# Patient Record
Sex: Female | Born: 1955 | Race: White | Hispanic: No | Marital: Married | Smoking: Former smoker
Health system: Southern US, Community
[De-identification: ages and names within clinical notes are randomized; demographics above are authoritative.]

## PROBLEM LIST (undated history)

## (undated) DIAGNOSIS — E785 Hyperlipidemia, unspecified: Secondary | ICD-10-CM

## (undated) DIAGNOSIS — Z973 Presence of spectacles and contact lenses: Secondary | ICD-10-CM

## (undated) DIAGNOSIS — F419 Anxiety disorder, unspecified: Secondary | ICD-10-CM

## (undated) DIAGNOSIS — M1990 Unspecified osteoarthritis, unspecified site: Secondary | ICD-10-CM

## (undated) HISTORY — PX: COLONOSCOPY: SHX174

## (undated) HISTORY — PX: WRIST FRACTURE SURGERY: SHX121

## (undated) HISTORY — PX: FRACTURE SURGERY: SHX138

---

## 1998-03-02 HISTORY — PX: SHOULDER ARTHROSCOPY: SHX128

## 2008-10-19 ENCOUNTER — Ambulatory Visit: Payer: Self-pay | Admitting: Family Medicine

## 2008-11-02 ENCOUNTER — Ambulatory Visit: Payer: Self-pay | Admitting: Family Medicine

## 2009-02-01 ENCOUNTER — Ambulatory Visit: Payer: Self-pay | Admitting: Unknown Physician Specialty

## 2009-04-26 ENCOUNTER — Ambulatory Visit: Payer: Self-pay | Admitting: Family Medicine

## 2010-08-14 ENCOUNTER — Ambulatory Visit: Payer: Self-pay | Admitting: Family Medicine

## 2011-08-20 ENCOUNTER — Ambulatory Visit: Payer: Self-pay | Admitting: Family Medicine

## 2011-10-22 ENCOUNTER — Ambulatory Visit: Payer: Self-pay | Admitting: Internal Medicine

## 2012-09-06 ENCOUNTER — Encounter (HOSPITAL_BASED_OUTPATIENT_CLINIC_OR_DEPARTMENT_OTHER): Payer: Self-pay | Admitting: *Deleted

## 2012-09-06 NOTE — Progress Notes (Signed)
No labs needed

## 2012-09-07 ENCOUNTER — Ambulatory Visit (HOSPITAL_BASED_OUTPATIENT_CLINIC_OR_DEPARTMENT_OTHER): Payer: 59 | Admitting: *Deleted

## 2012-09-07 ENCOUNTER — Encounter (HOSPITAL_BASED_OUTPATIENT_CLINIC_OR_DEPARTMENT_OTHER): Payer: Self-pay | Admitting: *Deleted

## 2012-09-07 ENCOUNTER — Ambulatory Visit (HOSPITAL_BASED_OUTPATIENT_CLINIC_OR_DEPARTMENT_OTHER)
Admission: RE | Admit: 2012-09-07 | Discharge: 2012-09-07 | Disposition: A | Discharge status: Home / Self Care | Payer: 59 | Source: Ambulatory Visit | Attending: General Surgery | Admitting: General Surgery

## 2012-09-07 ENCOUNTER — Encounter (HOSPITAL_BASED_OUTPATIENT_CLINIC_OR_DEPARTMENT_OTHER)
Admission: RE | Disposition: A | Discharge status: Home / Self Care | Payer: Self-pay | Source: Ambulatory Visit | Attending: General Surgery

## 2012-09-07 DIAGNOSIS — S52609A Unspecified fracture of lower end of unspecified ulna, initial encounter for closed fracture: Secondary | ICD-10-CM | POA: Insufficient documentation

## 2012-09-07 DIAGNOSIS — S52509A Unspecified fracture of the lower end of unspecified radius, initial encounter for closed fracture: Secondary | ICD-10-CM | POA: Insufficient documentation

## 2012-09-07 DIAGNOSIS — M129 Arthropathy, unspecified: Secondary | ICD-10-CM | POA: Insufficient documentation

## 2012-09-07 DIAGNOSIS — X58XXXA Exposure to other specified factors, initial encounter: Secondary | ICD-10-CM | POA: Insufficient documentation

## 2012-09-07 HISTORY — PX: ORIF RADIAL FRACTURE: SHX5113

## 2012-09-07 HISTORY — DX: Unspecified osteoarthritis, unspecified site: M19.90

## 2012-09-07 HISTORY — DX: Presence of spectacles and contact lenses: Z97.3

## 2012-09-07 HISTORY — DX: Hyperlipidemia, unspecified: E78.5

## 2012-09-07 SURGERY — OPEN REDUCTION INTERNAL FIXATION (ORIF) RADIAL FRACTURE
Anesthesia: General | Site: Wrist | Laterality: Right | Wound class: Clean

## 2012-09-07 MED ORDER — LIDOCAINE HCL (CARDIAC) 20 MG/ML IV SOLN
INTRAVENOUS | Status: DC | PRN
  Administered 2012-09-07: 100 mg via INTRAVENOUS

## 2012-09-07 MED ORDER — LACTATED RINGERS IV SOLN
INTRAVENOUS | Status: DC
  Administered 2012-09-07 (×2): via INTRAVENOUS

## 2012-09-07 MED ORDER — ROPIVACAINE HCL 5 MG/ML IJ SOLN
INTRAMUSCULAR | Status: DC | PRN
  Administered 2012-09-07: 30 mL via EPIDURAL

## 2012-09-07 MED ORDER — MIDAZOLAM HCL 2 MG/2ML IJ SOLN
1.0000 mg | INTRAMUSCULAR | Status: DC | PRN
  Administered 2012-09-07: 2 mg via INTRAVENOUS

## 2012-09-07 MED ORDER — PROPOFOL 10 MG/ML IV BOLUS
INTRAVENOUS | Status: DC | PRN
  Administered 2012-09-07: 150 mg via INTRAVENOUS

## 2012-09-07 MED ORDER — DEXAMETHASONE SODIUM PHOSPHATE 10 MG/ML IJ SOLN
INTRAMUSCULAR | Status: DC | PRN
  Administered 2012-09-07: 4 mg via INTRAVENOUS
  Administered 2012-09-07: 8 mg via INTRAVENOUS

## 2012-09-07 MED ORDER — 0.9 % SODIUM CHLORIDE (POUR BTL) OPTIME
TOPICAL | Status: DC | PRN
  Administered 2012-09-07: 250 mL

## 2012-09-07 MED ORDER — FENTANYL CITRATE 0.05 MG/ML IJ SOLN
50.0000 ug | INTRAMUSCULAR | Status: DC | PRN
  Administered 2012-09-07: 100 ug via INTRAVENOUS

## 2012-09-07 MED ORDER — CEFAZOLIN SODIUM-DEXTROSE 2-3 GM-% IV SOLR
INTRAVENOUS | Status: DC | PRN
  Administered 2012-09-07: 2 g via INTRAVENOUS

## 2012-09-07 SURGICAL SUPPLY — 63 items
BANDAGE ELASTIC 3 VELCRO ST LF (GAUZE/BANDAGES/DRESSINGS) ×2 IMPLANT
BLADE MINI RND TIP GREEN BEAV (BLADE) IMPLANT
BLADE SURG 15 STRL LF DISP TIS (BLADE) ×1 IMPLANT
BLADE SURG 15 STRL SS (BLADE) ×1
BNDG ELASTIC 2 VLCR STRL LF (GAUZE/BANDAGES/DRESSINGS) IMPLANT
BNDG ESMARK 4X9 LF (GAUZE/BANDAGES/DRESSINGS) ×2 IMPLANT
BNDG PLASTER X FAST 3X3 WHT LF (CAST SUPPLIES) IMPLANT
CHLORAPREP W/TINT 26ML (MISCELLANEOUS) ×2 IMPLANT
CLOTH BEACON ORANGE TIMEOUT ST (SAFETY) ×2 IMPLANT
CORDS BIPOLAR (ELECTRODE) ×2 IMPLANT
COVER MAYO STAND STRL (DRAPES) ×2 IMPLANT
COVER TABLE BACK 60X90 (DRAPES) ×2 IMPLANT
CUFF TOURNIQUET SINGLE 18IN (TOURNIQUET CUFF) ×2 IMPLANT
DRAIN TLS ROUND 10FR (DRAIN) IMPLANT
DRAPE EXTREMITY T 121X128X90 (DRAPE) ×2 IMPLANT
DRAPE OEC MINIVIEW 54X84 (DRAPES) ×2 IMPLANT
DRAPE SURG 17X23 STRL (DRAPES) ×2 IMPLANT
GAUZE XEROFORM 1X8 LF (GAUZE/BANDAGES/DRESSINGS) ×2 IMPLANT
GLOVE BIO SURGEON STRL SZ7 (GLOVE) ×2 IMPLANT
GLOVE BIOGEL M STRL SZ7.5 (GLOVE) ×4 IMPLANT
GLOVE BIOGEL PI IND STRL 7.0 (GLOVE) ×1 IMPLANT
GLOVE BIOGEL PI INDICATOR 7.0 (GLOVE) ×1
GLOVE EXAM NITRILE MD LF STRL (GLOVE) ×2 IMPLANT
GOWN BRE IMP PREV XXLGXLNG (GOWN DISPOSABLE) ×2 IMPLANT
GOWN PREVENTION PLUS XLARGE (GOWN DISPOSABLE) ×2 IMPLANT
K-WIRE .045X4 (WIRE) ×2 IMPLANT
NEEDLE HYPO 22GX1.5 SAFETY (NEEDLE) IMPLANT
NS IRRIG 1000ML POUR BTL (IV SOLUTION) ×2 IMPLANT
PACK BASIN DAY SURGERY FS (CUSTOM PROCEDURE TRAY) ×2 IMPLANT
PAD CAST 3X4 CTTN HI CHSV (CAST SUPPLIES) ×1 IMPLANT
PAD CAST 4YDX4 CTTN HI CHSV (CAST SUPPLIES) IMPLANT
PADDING CAST ABS 4INX4YD NS (CAST SUPPLIES) ×1
PADDING CAST ABS COTTON 4X4 ST (CAST SUPPLIES) ×1 IMPLANT
PADDING CAST COTTON 3X4 STRL (CAST SUPPLIES) ×1
PADDING CAST COTTON 4X4 STRL (CAST SUPPLIES)
PADDING CAST SYNTHETIC 3 NS LF (CAST SUPPLIES)
PADDING CAST SYNTHETIC 3X4 NS (CAST SUPPLIES) IMPLANT
PLATE ANATOMIC NARROW RIGHT (Plate) ×2 IMPLANT
SCREW BONE 2.3X12MM (Screw) ×4 IMPLANT
SCREW BONE 2.3X14MM (Screw) ×2 IMPLANT
SCREW BONE 2.7X12 (Screw) ×2 IMPLANT
SCREW LOCK 16X2.3 (Screw) ×2 IMPLANT
SCREW LOCKING 2.7X16 (Screw) ×2 IMPLANT
SCREW LOCKING 2.7X18 (Screw) ×6 IMPLANT
SLEEVE SCD COMPRESS KNEE MED (MISCELLANEOUS) ×2 IMPLANT
SPLINT FIBERGLASS 3X35 (CAST SUPPLIES) ×2 IMPLANT
SPLINT PLASTER CAST XFAST 4X15 (CAST SUPPLIES) IMPLANT
SPLINT PLASTER XTRA FAST SET 4 (CAST SUPPLIES)
SPONGE GAUZE 4X4 12PLY (GAUZE/BANDAGES/DRESSINGS) ×2 IMPLANT
STOCKINETTE 4X48 STRL (DRAPES) ×2 IMPLANT
SUCTION FRAZIER TIP 10 FR DISP (SUCTIONS) IMPLANT
SUT ETHILON 3 0 PS 1 (SUTURE) IMPLANT
SUT ETHILON 4 0 PS 2 18 (SUTURE) IMPLANT
SUT VIC AB 3-0 PS1 18 (SUTURE)
SUT VIC AB 3-0 PS1 18XBRD (SUTURE) IMPLANT
SUT VIC AB 3-0 SH 27 (SUTURE) ×1
SUT VIC AB 3-0 SH 27X BRD (SUTURE) ×1 IMPLANT
SUT VICRYL 4-0 PS2 18IN ABS (SUTURE) ×2 IMPLANT
SYR BULB 3OZ (MISCELLANEOUS) ×2 IMPLANT
SYR CONTROL 10ML LL (SYRINGE) IMPLANT
TOWEL OR 17X24 6PK STRL BLUE (TOWEL DISPOSABLE) ×2 IMPLANT
TUBE CONNECTING 20X1/4 (TUBING) IMPLANT
UNDERPAD 30X30 INCONTINENT (UNDERPADS AND DIAPERS) ×2 IMPLANT

## 2012-09-07 NOTE — Interval H&P Note (Signed)
History and Physical Interval Note:  09/07/2012 11:57 AM  Alexandra Chen  has presented today for surgery, with the diagnosis of Right Distal Radius Fracture  The various methods of treatment have been discussed with the patient and family. After consideration of risks, benefits and other options for treatment, the patient has consented to  Procedure(s): OPEN REDUCTION INTERNAL FIXATION (ORIF) RIGHT DISTAL RADIAL FRACTURE (Right) as a surgical intervention .  The patient's history has been reviewed, patient examined, no change in status, stable for surgery.  I have reviewed the patient's chart and labs.  Questions were answered to the patient's satisfaction.     Elsey Holts CHRISTOPHER

## 2012-09-07 NOTE — Anesthesia Preprocedure Evaluation (Addendum)
Anesthesia Evaluation  Patient identified by MRN, date of birth, ID band Patient awake    Reviewed: Allergy & Precautions, H&P , NPO status , Patient's Chart, lab work & pertinent test results  Airway       Dental   Pulmonary          Cardiovascular     Neuro/Psych    GI/Hepatic   Endo/Other    Renal/GU      Musculoskeletal  (+) Arthritis -,   Abdominal   Peds  Hematology   Anesthesia Other Findings   Reproductive/Obstetrics                           Anesthesia Physical Anesthesia Plan  ASA: II  Anesthesia Plan:    Post-op Pain Management:    Induction:   Airway Management Planned:   Additional Equipment:   Intra-op Plan:   Post-operative Plan:   Informed Consent:   Plan Discussed with:   Anesthesia Plan Comments:         Anesthesia Quick Evaluation

## 2012-09-07 NOTE — H&P (View-Only) (Signed)
S:  Pt seen in preop for pending ORIF of R distal radius, pt's questions answered.  O:Blood pressure 137/72, pulse 57, temperature 98.1 F (36.7 C), temperature source Oral, resp. rate 11, height 5\' 4"  (1.626 m), weight 72.576 kg (160 lb), SpO2 99.00%. No results found for this or any previous visit. Pt's RUE unchanged.  A:R distal radius fracture   P:  Proceed with ORIF R distal radius fx

## 2012-09-07 NOTE — Anesthesia Postprocedure Evaluation (Signed)
Anesthesia Post Note  Patient: Alexandra Chen  Procedure(s) Performed: Procedure(s) (LRB): OPEN REDUCTION INTERNAL FIXATION (ORIF) RIGHT DISTAL RADIAL FRACTURE (Right)  Anesthesia type: general  Patient location: PACU  Post pain: Pain level controlled  Post assessment: Patient's Cardiovascular Status Stable  Last Vitals:  Filed Vitals:   09/07/12 1315  BP: 150/76  Pulse: 63  Temp:   Resp: 9    Post vital signs: Reviewed and stable  Level of consciousness: sedated  Complications: No apparent anesthesia complications

## 2012-09-07 NOTE — Progress Notes (Signed)
S:  Pt seen in preop for pending ORIF of R distal radius, pt's questions answered.  O:Blood pressure 137/72, pulse 57, temperature 98.1 F (36.7 C), temperature source Oral, resp. rate 11, height 5' 4" (1.626 m), weight 72.576 kg (160 lb), SpO2 99.00%. No results found for this or any previous visit. Pt's RUE unchanged.  A:R distal radius fracture   P:  Proceed with ORIF R distal radius fx  

## 2012-09-07 NOTE — Progress Notes (Signed)
Assisted Dr. Ossey with right, ultrasound guided, supraclavicular block. Side rails up, monitors on throughout procedure. See vital signs in flow sheet. Tolerated Procedure well. 

## 2012-09-07 NOTE — Transfer of Care (Signed)
Immediate Anesthesia Transfer of Care Note  Patient: Alexandra Chen  Procedure(s) Performed: Procedure(s): OPEN REDUCTION INTERNAL FIXATION (ORIF) RIGHT DISTAL RADIAL FRACTURE (Right)  Patient Location: PACU  Anesthesia Type:General and Regional  Level of Consciousness: awake, alert  and oriented  Airway & Oxygen Therapy: Patient Spontanous Breathing and Patient connected to face mask oxygen  Post-op Assessment: Report given to PACU RN and Post -op Vital signs reviewed and stable  Post vital signs: Reviewed and stable  Complications: No apparent anesthesia complications

## 2012-09-07 NOTE — Anesthesia Procedure Notes (Addendum)
Anesthesia Regional Block:  Supraclavicular block  Pre-Anesthetic Checklist: ,, timeout performed, Correct Patient, Correct Site, Correct Laterality, Correct Procedure, Correct Position, site marked, Risks and benefits discussed,  Surgical consent,  Pre-op evaluation,  At surgeon's request and post-op pain management  Laterality: Right  Prep: chloraprep       Needles:  Injection technique: Single-shot  Needle Type: Echogenic Stimulator Needle     Needle Length: 5cm 5 cm     Additional Needles:  Procedures: ultrasound guided (picture in chart) and nerve stimulator Supraclavicular block  Nerve Stimulator or Paresthesia:  Response: 0.4 mA,   Additional Responses:   Narrative:  Start time: 09/07/2012 10:56 AM End time: 09/07/2012 11:04 AM Injection made incrementally with aspirations every 5 mL.  Performed by: Personally  Anesthesiologist: Arta Bruce MD  Additional Notes: Monitors applied. Patient sedated. Sterile prep and drape,hand hygiene and sterile gloves were used. Relevant anatomy identified.Needle position confirmed.Local anesthetic injected incrementally after negative aspiration. Local anesthetic spread visualized around nerve(s). Vascular puncture avoided. No complications. Image printed for medical record.The patient tolerated the procedure well.       Supraclavicular block Procedure Name: LMA Insertion Date/Time: 09/07/2012 11:54 AM Performed by: Suann Larry WOLFE Pre-anesthesia Checklist: Patient identified, Emergency Drugs available, Suction available and Patient being monitored Patient Re-evaluated:Patient Re-evaluated prior to inductionOxygen Delivery Method: Circle System Utilized Preoxygenation: Pre-oxygenation with 100% oxygen Intubation Type: IV induction Ventilation: Mask ventilation without difficulty LMA: LMA inserted LMA Size: 4.0 Number of attempts: 1 Airway Equipment and Method: bite block Placement Confirmation: positive ETCO2 and breath  sounds checked- equal and bilateral Tube secured with: Tape Dental Injury: Teeth and Oropharynx as per pre-operative assessment

## 2012-09-08 ENCOUNTER — Encounter (HOSPITAL_BASED_OUTPATIENT_CLINIC_OR_DEPARTMENT_OTHER): Payer: Self-pay | Admitting: General Surgery

## 2012-09-08 NOTE — Op Note (Signed)
NAME:  Alexandra Chen, BLALOCK NO.:  [id number]  MEDICAL RECORD NO.:  [id number]  LOCATION:                               FACILITY:  [facility]  PHYSICIAN:  Johnette Abraham, MD    DATE OF BIRTH:  Mar 25, 1955  DATE OF PROCEDURE:  1955/10/16 DATE OF DISCHARGE:  09/07/2012                              OPERATIVE REPORT   PREOPERATIVE DIAGNOSIS:  Closed fracture to the right distal radius and ulnar styloid.  POSTOPERATIVE DIAGNOSIS:  Closed fracture to the right distal radius and ulnar styloid.  PROCEDURE:  Open reduction and internal fixation of the right distal radius fracture with a Stryker volar plate.  ANESTHESIA:  Supraclavicular block and general anesthesia.  ESTIMATED BLOOD LOSS:  Minimal.  COMPLICATIONS:  No acute complications.  SPECIMENS:  No specimens.  INDICATIONS:  [name] is a pleasant lady who fell off a horse over the weekend sustaining closed fracture to her wrist.  She is seen in my office for definitive care.  Risks, benefits, and alternatives of surgical fixation were thoroughly discussed with her.  She agreed to proceed with this course of action.  Consent was obtained.  PROCEDURE:  The patient was taken to the operating room after Anesthesia provided a supraclavicular block.  She was placed supine on the operating table.  Time-out was performed.  General anesthesia was administered.  The right upper extremity was prepped and draped in normal sterile fashion.  The arm was exsanguinated and tourniquet was inflated to 250 mmHg.  A volar incision was made overlying the FCR tendon.  Dissection was carried down from the FCR tendon to the radial artery and pronator quadratus muscle.  Pronator quadratus muscle was taken down in a hockey-stick type fashion exposing the fracture site. Fracture site was in multiple parts, it was intra-articular.  Fracture site was washed out.  Muscle was removed from the fracture site and was reduced.  Appropriate-sized  Stryker volar plate was chosen.  Temporarily held in place with K-wires.  Fluoroscopy revealed good plate placement. Afterwards the radial shaft screws were each drilled and appropriate size cortical screws were placed.  Again fluoroscopy was used to check the plate placement and screw length, which was good.  Afterwards the remaining distal radius screws were each drilled and appropriate size locking screws were placed.  Fluoroscopy again was used to confirm no screw was in the joint and good screw and plate placement.  Afterwards, the wound was irrigated with irrigation solution.  The pronator quadratus muscle was loosely approximated over the plate.  The deep fascia was closed as well as subcutaneous tissues with 3-0 Vicryl and the skin was closed with running 4-0 Vicryl.  Sterile dressing and splint were applied.  The patient tolerated the procedure well.  Fingers were nice and pink at the conclusion of the procedure.     Johnette Abraham, MD     HCC/MEDQ  D:  09/07/2012  T:  09/08/2012  Job:  [id number]

## 2012-09-14 ENCOUNTER — Ambulatory Visit: Payer: Self-pay | Admitting: Internal Medicine

## 2013-09-15 ENCOUNTER — Ambulatory Visit: Payer: Self-pay | Admitting: Internal Medicine

## 2013-11-10 DIAGNOSIS — M810 Age-related osteoporosis without current pathological fracture: Secondary | ICD-10-CM | POA: Insufficient documentation

## 2014-09-28 ENCOUNTER — Other Ambulatory Visit: Payer: Self-pay | Admitting: Internal Medicine

## 2014-09-28 DIAGNOSIS — Z1231 Encounter for screening mammogram for malignant neoplasm of breast: Secondary | ICD-10-CM

## 2014-09-28 DIAGNOSIS — E782 Mixed hyperlipidemia: Secondary | ICD-10-CM | POA: Insufficient documentation

## 2014-09-28 DIAGNOSIS — I10 Essential (primary) hypertension: Secondary | ICD-10-CM | POA: Insufficient documentation

## 2014-10-02 ENCOUNTER — Ambulatory Visit
Admission: RE | Admit: 2014-10-02 | Discharge: 2014-10-02 | Disposition: A | Discharge status: Home / Self Care | Payer: 59 | Source: Ambulatory Visit | Attending: Internal Medicine | Admitting: Internal Medicine

## 2014-10-02 DIAGNOSIS — Z1231 Encounter for screening mammogram for malignant neoplasm of breast: Secondary | ICD-10-CM | POA: Insufficient documentation

## 2014-10-04 ENCOUNTER — Other Ambulatory Visit: Payer: Self-pay | Admitting: Internal Medicine

## 2014-10-04 DIAGNOSIS — N63 Unspecified lump in unspecified breast: Secondary | ICD-10-CM

## 2014-10-04 DIAGNOSIS — R928 Other abnormal and inconclusive findings on diagnostic imaging of breast: Secondary | ICD-10-CM

## 2014-10-12 ENCOUNTER — Ambulatory Visit: Payer: 59

## 2014-10-12 ENCOUNTER — Ambulatory Visit
Admission: RE | Admit: 2014-10-12 | Discharge: 2014-10-12 | Disposition: A | Discharge status: Home / Self Care | Payer: 59 | Source: Ambulatory Visit | Attending: Internal Medicine | Admitting: Internal Medicine

## 2014-10-12 DIAGNOSIS — N63 Unspecified lump in unspecified breast: Secondary | ICD-10-CM

## 2014-10-12 DIAGNOSIS — R928 Other abnormal and inconclusive findings on diagnostic imaging of breast: Secondary | ICD-10-CM

## 2015-09-16 ENCOUNTER — Other Ambulatory Visit: Payer: Self-pay | Admitting: Internal Medicine

## 2015-09-16 DIAGNOSIS — Z1231 Encounter for screening mammogram for malignant neoplasm of breast: Secondary | ICD-10-CM

## 2015-10-04 ENCOUNTER — Other Ambulatory Visit: Payer: Self-pay | Admitting: Internal Medicine

## 2015-10-04 ENCOUNTER — Ambulatory Visit
Admission: RE | Admit: 2015-10-04 | Discharge: 2015-10-04 | Disposition: A | Discharge status: Home / Self Care | Payer: 59 | Source: Ambulatory Visit | Attending: Internal Medicine | Admitting: Internal Medicine

## 2015-10-04 DIAGNOSIS — Z1231 Encounter for screening mammogram for malignant neoplasm of breast: Secondary | ICD-10-CM

## 2015-10-11 ENCOUNTER — Ambulatory Visit: Payer: 59

## 2016-04-03 DIAGNOSIS — R0683 Snoring: Secondary | ICD-10-CM | POA: Insufficient documentation

## 2016-07-31 DIAGNOSIS — G4733 Obstructive sleep apnea (adult) (pediatric): Secondary | ICD-10-CM | POA: Insufficient documentation

## 2016-09-18 ENCOUNTER — Other Ambulatory Visit: Payer: Self-pay | Admitting: Internal Medicine

## 2016-09-18 DIAGNOSIS — Z1231 Encounter for screening mammogram for malignant neoplasm of breast: Secondary | ICD-10-CM

## 2016-10-09 ENCOUNTER — Ambulatory Visit
Admission: RE | Admit: 2016-10-09 | Discharge: 2016-10-09 | Disposition: A | Discharge status: Home / Self Care | Payer: 59 | Source: Ambulatory Visit | Attending: Internal Medicine | Admitting: Internal Medicine

## 2016-10-09 DIAGNOSIS — Z1231 Encounter for screening mammogram for malignant neoplasm of breast: Secondary | ICD-10-CM | POA: Diagnosis present

## 2017-02-12 DIAGNOSIS — M62838 Other muscle spasm: Secondary | ICD-10-CM | POA: Insufficient documentation

## 2017-09-17 ENCOUNTER — Other Ambulatory Visit: Payer: Self-pay | Admitting: Internal Medicine

## 2017-09-17 DIAGNOSIS — Z1231 Encounter for screening mammogram for malignant neoplasm of breast: Secondary | ICD-10-CM

## 2017-10-15 ENCOUNTER — Ambulatory Visit
Admission: RE | Admit: 2017-10-15 | Discharge: 2017-10-15 | Disposition: A | Discharge status: Home / Self Care | Payer: Managed Care, Other (non HMO) | Source: Ambulatory Visit | Attending: Internal Medicine | Admitting: Internal Medicine

## 2017-10-15 DIAGNOSIS — Z1231 Encounter for screening mammogram for malignant neoplasm of breast: Secondary | ICD-10-CM | POA: Diagnosis not present

## 2017-12-24 DIAGNOSIS — F411 Generalized anxiety disorder: Secondary | ICD-10-CM | POA: Insufficient documentation

## 2018-09-16 ENCOUNTER — Other Ambulatory Visit: Payer: Self-pay | Admitting: Physician Assistant

## 2018-09-16 DIAGNOSIS — Z1231 Encounter for screening mammogram for malignant neoplasm of breast: Secondary | ICD-10-CM

## 2018-10-20 ENCOUNTER — Ambulatory Visit
Admission: RE | Admit: 2018-10-20 | Discharge: 2018-10-20 | Disposition: A | Discharge status: Home / Self Care | Payer: Managed Care, Other (non HMO) | Source: Ambulatory Visit | Attending: Physician Assistant | Admitting: Physician Assistant

## 2018-10-20 DIAGNOSIS — Z1231 Encounter for screening mammogram for malignant neoplasm of breast: Secondary | ICD-10-CM | POA: Insufficient documentation

## 2019-06-23 ENCOUNTER — Other Ambulatory Visit: Payer: Self-pay

## 2019-06-23 ENCOUNTER — Ambulatory Visit (INDEPENDENT_AMBULATORY_CARE_PROVIDER_SITE_OTHER): Payer: Self-pay | Admitting: Gastroenterology

## 2019-06-23 DIAGNOSIS — S62109A Fracture of unspecified carpal bone, unspecified wrist, initial encounter for closed fracture: Secondary | ICD-10-CM | POA: Insufficient documentation

## 2019-06-23 DIAGNOSIS — Z1211 Encounter for screening for malignant neoplasm of colon: Secondary | ICD-10-CM

## 2019-06-23 DIAGNOSIS — M1990 Unspecified osteoarthritis, unspecified site: Secondary | ICD-10-CM | POA: Insufficient documentation

## 2019-06-23 NOTE — Progress Notes (Signed)
Gastroenterology Pre-Procedure Review  Request Date: 07/21/19 Requesting Physician: Dr. Tobi Bastos  PATIENT REVIEW QUESTIONS: The patient responded to the following health history questions as indicated:    1. Are you having any GI issues? no 2. Do you have a personal history of Polyps? no 3. Do you have a family history of Colon Cancer or Polyps? no 4. Diabetes Mellitus? no 5. Joint replacements in the past 12 months?no 6. Major health problems in the past 3 months?no 7. Any artificial heart valves, MVP, or defibrillator?no    MEDICATIONS & ALLERGIES:    Patient reports the following regarding taking any anticoagulation/antiplatelet therapy:   Plavix, Coumadin, Eliquis, Xarelto, Lovenox, Pradaxa, Brilinta, or Effient? no Aspirin? no  Patient confirms/reports the following medications:  Current Outpatient Medications  Medication Sig Dispense Refill  . amLODipine (NORVASC) 5 MG tablet Take by mouth.    . diclofenac (VOLTAREN) 75 MG EC tablet TAKE 1 TABLET BY MOUTH  TWICE DAILY WITH MEALS    . escitalopram (LEXAPRO) 20 MG tablet Take by mouth.    . hydrochlorothiazide (HYDRODIURIL) 12.5 MG tablet TAKE 1 TABLET BY MOUTH ONCE DAILY    . ibandronate (BONIVA) 150 MG tablet Take by mouth.    Marland Kitchen tiZANidine (ZANAFLEX) 2 MG tablet Take by mouth.    Marland Kitchen atorvastatin (LIPITOR) 10 MG tablet Take 10 mg by mouth daily.    . B Complex-C (B-COMPLEX WITH VITAMIN C) tablet Take 1 tablet by mouth daily.    . cholecalciferol (VITAMIN D) 1000 UNITS tablet Take 1,000 Units by mouth daily.    Marland Kitchen glucosamine-chondroitin 500-400 MG tablet Take 1 tablet by mouth 3 (three) times daily.    Marland Kitchen HYDROcodone-acetaminophen (LORTAB) 10-500 MG per tablet Take 1 tablet by mouth every 6 (six) hours as needed for pain.    . Multiple Vitamin (MULTI-VITAMIN) tablet Take by mouth.    . Multiple Vitamins-Minerals (MULTIVITAMIN WITH MINERALS) tablet Take 1 tablet by mouth daily.     No current facility-administered medications for  this visit.    Patient confirms/reports the following allergies:  Allergies  Allergen Reactions  . Sulfa Antibiotics Rash and Hives    No orders of the defined types were placed in this encounter.   AUTHORIZATION INFORMATION Primary Insurance: 1D#: Group #:  Secondary Insurance: 1D#: Group #:  SCHEDULE INFORMATION: Date: 07/21/19 Time: Location:[facility]

## 2019-06-23 NOTE — Progress Notes (Signed)
Gastroenterology Pre-Procedure Review  Request Date: 07/21/19 Requesting Physician: Dr. Tobi Bastos  PATIENT REVIEW QUESTIONS: The patient responded to the following health history questions as indicated:    1. Are you having any GI issues? no 2. Do you have a personal history of Polyps? no 3. Do you have a family history of Colon Cancer or Polyps? no 4. Diabetes Mellitus? no 5. Joint replacements in the past 12 months?no 6. Major health problems in the past 3 months?no 7. Any artificial heart valves, MVP, or defibrillator?no    MEDICATIONS & ALLERGIES:    Patient reports the following regarding taking any anticoagulation/antiplatelet therapy:   Plavix, Coumadin, Eliquis, Xarelto, Lovenox, Pradaxa, Brilinta, or Effient? no Aspirin? no  Patient confirms/reports the following medications:  Current Outpatient Medications  Medication Sig Dispense Refill  . amLODipine (NORVASC) 5 MG tablet Take by mouth.    Marland Kitchen atorvastatin (LIPITOR) 10 MG tablet Take 10 mg by mouth daily.    . B Complex-C (B-COMPLEX WITH VITAMIN C) tablet Take 1 tablet by mouth daily.    . cholecalciferol (VITAMIN D) 1000 UNITS tablet Take 1,000 Units by mouth daily.    . diclofenac (VOLTAREN) 75 MG EC tablet TAKE 1 TABLET BY MOUTH  TWICE DAILY WITH MEALS    . escitalopram (LEXAPRO) 20 MG tablet Take by mouth.    Marland Kitchen glucosamine-chondroitin 500-400 MG tablet Take 1 tablet by mouth 3 (three) times daily.    . hydrochlorothiazide (HYDRODIURIL) 12.5 MG tablet TAKE 1 TABLET BY MOUTH ONCE DAILY    . HYDROcodone-acetaminophen (LORTAB) 10-500 MG per tablet Take 1 tablet by mouth every 6 (six) hours as needed for pain.    Marland Kitchen ibandronate (BONIVA) 150 MG tablet Take by mouth.    . Multiple Vitamin (MULTI-VITAMIN) tablet Take by mouth.    . Multiple Vitamins-Minerals (MULTIVITAMIN WITH MINERALS) tablet Take 1 tablet by mouth daily.    Marland Kitchen tiZANidine (ZANAFLEX) 2 MG tablet Take by mouth.     No current facility-administered medications for  this visit.    Patient confirms/reports the following allergies:  Allergies  Allergen Reactions  . Sulfa Antibiotics Rash and Hives    No orders of the defined types were placed in this encounter.   AUTHORIZATION INFORMATION Primary Insurance: 1D#: Group #:  Secondary Insurance: 1D#: Group #:  SCHEDULE INFORMATION: Date: 07/21/19 Time: Location:[facility]

## 2019-07-04 ENCOUNTER — Telehealth: Payer: Self-pay

## 2019-07-04 NOTE — Telephone Encounter (Signed)
LVM asking patient to call me back in regards to her colonoscopy instructions.  They were provided upon her nurse visit with me to schedule, however I will be happy to mail a copy of the instructions.  Thanks,  [place], [place]

## 2019-07-04 NOTE — Telephone Encounter (Signed)
Called patient back.  She has been rescheduled from 06/07 to 08/04/19 with Dr. Tobi Bastos.  Trish in Endo has been made aware of date change.  New instructions will be mailed to patient to reflect this date.  Referral updated.  Thanks [place], [place]

## 2019-08-02 ENCOUNTER — Other Ambulatory Visit: Payer: Self-pay

## 2019-08-02 ENCOUNTER — Other Ambulatory Visit
Admission: RE | Admit: 2019-08-02 | Discharge: 2019-08-02 | Disposition: A | Discharge status: Home / Self Care | Payer: Managed Care, Other (non HMO) | Source: Ambulatory Visit | Attending: Gastroenterology | Admitting: Gastroenterology

## 2019-08-02 DIAGNOSIS — Z20822 Contact with and (suspected) exposure to covid-19: Secondary | ICD-10-CM | POA: Insufficient documentation

## 2019-08-02 DIAGNOSIS — Z01812 Encounter for preprocedural laboratory examination: Secondary | ICD-10-CM | POA: Diagnosis present

## 2019-08-02 LAB — SARS CORONAVIRUS 2 (TAT 6-24 HRS): SARS Coronavirus 2: NEGATIVE

## 2019-08-04 ENCOUNTER — Encounter: Payer: Self-pay | Admitting: Gastroenterology

## 2019-08-04 ENCOUNTER — Ambulatory Visit: Payer: Managed Care, Other (non HMO) | Admitting: Anesthesiology

## 2019-08-04 ENCOUNTER — Ambulatory Visit
Admission: RE | Admit: 2019-08-04 | Discharge: 2019-08-04 | Disposition: A | Discharge status: Home / Self Care | Payer: Managed Care, Other (non HMO) | Attending: Gastroenterology | Admitting: Gastroenterology

## 2019-08-04 ENCOUNTER — Other Ambulatory Visit: Payer: Self-pay

## 2019-08-04 ENCOUNTER — Encounter
Admission: RE | Disposition: A | Discharge status: Home / Self Care | Payer: Self-pay | Source: Home / Self Care | Attending: Gastroenterology

## 2019-08-04 DIAGNOSIS — Z87891 Personal history of nicotine dependence: Secondary | ICD-10-CM | POA: Diagnosis not present

## 2019-08-04 DIAGNOSIS — Z882 Allergy status to sulfonamides status: Secondary | ICD-10-CM | POA: Insufficient documentation

## 2019-08-04 DIAGNOSIS — F419 Anxiety disorder, unspecified: Secondary | ICD-10-CM | POA: Insufficient documentation

## 2019-08-04 DIAGNOSIS — M1990 Unspecified osteoarthritis, unspecified site: Secondary | ICD-10-CM | POA: Diagnosis not present

## 2019-08-04 DIAGNOSIS — K5730 Diverticulosis of large intestine without perforation or abscess without bleeding: Secondary | ICD-10-CM | POA: Diagnosis not present

## 2019-08-04 DIAGNOSIS — E785 Hyperlipidemia, unspecified: Secondary | ICD-10-CM | POA: Diagnosis not present

## 2019-08-04 DIAGNOSIS — Z79899 Other long term (current) drug therapy: Secondary | ICD-10-CM | POA: Diagnosis not present

## 2019-08-04 DIAGNOSIS — Z1211 Encounter for screening for malignant neoplasm of colon: Secondary | ICD-10-CM | POA: Diagnosis not present

## 2019-08-04 DIAGNOSIS — Z791 Long term (current) use of non-steroidal anti-inflammatories (NSAID): Secondary | ICD-10-CM | POA: Insufficient documentation

## 2019-08-04 HISTORY — PX: COLONOSCOPY WITH PROPOFOL: SHX5780

## 2019-08-04 HISTORY — DX: Anxiety disorder, unspecified: F41.9

## 2019-08-04 SURGERY — COLONOSCOPY WITH PROPOFOL
Anesthesia: General

## 2019-08-04 MED ORDER — SODIUM CHLORIDE 0.9 % IV SOLN
INTRAVENOUS | Status: DC
  Administered 2019-08-04: 1000 mL via INTRAVENOUS

## 2019-08-04 MED ORDER — GLYCOPYRROLATE 0.2 MG/ML IJ SOLN
INTRAMUSCULAR | Status: DC | PRN
  Administered 2019-08-04: .2 mg via INTRAVENOUS

## 2019-08-04 MED ORDER — GLYCOPYRROLATE 0.2 MG/ML IJ SOLN
INTRAMUSCULAR | Status: AC
  Filled 2019-08-04: qty 1

## 2019-08-04 MED ORDER — PROPOFOL 500 MG/50ML IV EMUL
INTRAVENOUS | Status: DC | PRN
  Administered 2019-08-04: 150 ug/kg/min via INTRAVENOUS

## 2019-08-04 MED ORDER — LIDOCAINE HCL (CARDIAC) PF 100 MG/5ML IV SOSY
PREFILLED_SYRINGE | INTRAVENOUS | Status: DC | PRN
  Administered 2019-08-04: 80 mg via INTRAVENOUS
  Administered 2019-08-04: 20 mg via INTRAVENOUS

## 2019-08-04 MED ORDER — PROPOFOL 10 MG/ML IV BOLUS
INTRAVENOUS | Status: DC | PRN
  Administered 2019-08-04: 70 mg via INTRAVENOUS

## 2019-08-04 NOTE — Transfer of Care (Signed)
Immediate Anesthesia Transfer of Care Note  Patient: Alexandra Chen  Procedure(s) Performed: COLONOSCOPY WITH PROPOFOL (N/A )  Patient Location: Endoscopy Unit  Anesthesia Type:General  Level of Consciousness: drowsy  Airway & Oxygen Therapy: Patient Spontanous Breathing  Post-op Assessment: Report given to RN and Post -op Vital signs reviewed and stable  Post vital signs: Reviewed and stable  Last Vitals:  Vitals Value Taken Time  BP    Temp    Pulse 66 08/04/19 1032  Resp 11 08/04/19 1032  SpO2 98 % 08/04/19 1032  Vitals shown include unvalidated device data.  Last Pain:  Vitals:   08/04/19 0931  TempSrc: Temporal  PainSc: 0-No pain         Complications: No apparent anesthesia complications

## 2019-08-04 NOTE — Anesthesia Preprocedure Evaluation (Signed)
Anesthesia Evaluation  Patient identified by MRN, date of birth, ID band Patient awake    Reviewed: Allergy & Precautions, H&P , NPO status , Patient's Chart, lab work & pertinent test results  History of Anesthesia Complications Negative for: history of anesthetic complications  Airway Mallampati: III  TM Distance: >3 FB Neck ROM: full    Dental  (+) Chipped   Pulmonary neg shortness of breath, sleep apnea and Continuous Positive Airway Pressure Ventilation , former smoker,    Pulmonary exam normal        Cardiovascular Exercise Tolerance: Good hypertension, (-) angina(-) Past MI and (-) DOE Normal cardiovascular exam     Neuro/Psych PSYCHIATRIC DISORDERS negative neurological ROS     GI/Hepatic negative GI ROS, Neg liver ROS, neg GERD  ,  Endo/Other  negative endocrine ROS  Renal/GU negative Renal ROS  negative genitourinary   Musculoskeletal  (+) Arthritis ,   Abdominal   Peds  Hematology negative hematology ROS (+)   Anesthesia Other Findings Past Medical History: No date: Anxiety No date: Arthritis No date: Hyperlipemia No date: Wears glasses  Past Surgical History: No date: COLONOSCOPY No date: FRACTURE SURGERY 09/07/2012: ORIF RADIAL FRACTURE; Right     Comment:  Procedure: OPEN REDUCTION INTERNAL FIXATION (ORIF) RIGHT              DISTAL RADIAL FRACTURE;  Surgeon: Knute Neu, MD;                Location: [facility];  Service: Plastics;               Laterality: Right; 2000: SHOULDER ARTHROSCOPY     Comment:  right No date: WRIST FRACTURE SURGERY     Comment:  rt set p fx  BMI    Body Mass Index: 30.18 kg/m      Reproductive/Obstetrics negative OB ROS                             Anesthesia Physical Anesthesia Plan  ASA: III  Anesthesia Plan: General   Post-op Pain Management:    Induction: Intravenous  PONV Risk Score and Plan: Propofol  infusion and TIVA  Airway Management Planned: Natural Airway and Nasal Cannula  Additional Equipment:   Intra-op Plan:   Post-operative Plan:   Informed Consent: I have reviewed the patients History and Physical, chart, labs and discussed the procedure including the risks, benefits and alternatives for the proposed anesthesia with the patient or authorized representative who has indicated his/her understanding and acceptance.     Dental Advisory Given  Plan Discussed with: Anesthesiologist, CRNA and Surgeon  Anesthesia Plan Comments: (Patient consented for risks of anesthesia including but not limited to:  - adverse reactions to medications - risk of intubation if required - damage to eyes, teeth, lips or other oral mucosa - nerve damage due to positioning  - sore throat or hoarseness - Damage to heart, brain, nerves, lungs, other parts of body or loss of life  Patient voiced understanding.)        Anesthesia Quick Evaluation

## 2019-08-04 NOTE — H&P (Signed)
Wyline Mood, MD [address], [place], [place], [address] [address], [address], [place], [place], [address] Phone: [phone]  Fax: [phone]  Primary Care Physician:  Patrice Paradise, MD   Pre-Procedure History & Physical: HPI:  Alexandra Chen is a 64 y.o. female is here for an colonoscopy.   Past Medical History:  Diagnosis Date  . Anxiety   . Arthritis   . Hyperlipemia   . Wears glasses     Past Surgical History:  Procedure Laterality Date  . COLONOSCOPY    . FRACTURE SURGERY    . ORIF RADIAL FRACTURE Right 09/07/2012   Procedure: OPEN REDUCTION INTERNAL FIXATION (ORIF) RIGHT DISTAL RADIAL FRACTURE;  Surgeon: Knute Neu, MD;  Location: [facility];  Service: Plastics;  Laterality: Right;  . SHOULDER ARTHROSCOPY  2000   right  . WRIST FRACTURE SURGERY     rt set p fx    Prior to Admission medications   Medication Sig Start Date End Date Taking? Authorizing Provider  amLODipine (NORVASC) 5 MG tablet Take by mouth. 11/28/18  Yes [provider]  atorvastatin (LIPITOR) 10 MG tablet Take 10 mg by mouth daily.   Yes [provider]  B Complex-C (B-COMPLEX WITH VITAMIN C) tablet Take 1 tablet by mouth daily.   Yes [provider]  cholecalciferol (VITAMIN D) 1000 UNITS tablet Take 1,000 Units by mouth daily.   Yes [provider]  diclofenac (VOLTAREN) 75 MG EC tablet TAKE 1 TABLET BY MOUTH  TWICE DAILY WITH MEALS 02/06/19  Yes [provider]  escitalopram (LEXAPRO) 20 MG tablet Take by mouth. 02/09/19  Yes [provider]  glucosamine-chondroitin 500-400 MG tablet Take 1 tablet by mouth 3 (three) times daily.   Yes [provider]  hydrochlorothiazide (HYDRODIURIL) 12.5 MG tablet TAKE 1 TABLET BY MOUTH ONCE DAILY 02/15/19  Yes [provider]  HYDROcodone-acetaminophen (LORTAB) 10-500 MG per tablet Take 1 tablet by mouth every 6 (six) hours as needed for pain.    Yes [provider]  ibandronate (BONIVA) 150 MG tablet Take by mouth. 11/28/18  Yes [provider]  Multiple Vitamin (MULTI-VITAMIN) tablet Take by mouth.   Yes [provider]  Multiple Vitamins-Minerals (MULTIVITAMIN WITH MINERALS) tablet Take 1 tablet by mouth daily.   Yes [provider]  tiZANidine (ZANAFLEX) 2 MG tablet Take by mouth. 02/12/17  Yes [provider]    Allergies as of 06/23/2019 - Review Complete 06/23/2019  Allergen Reaction Noted  . Sulfa antibiotics Rash and Hives 10/02/2011    Family History  Problem Relation Age of Onset  . Breast cancer Maternal Grandmother     Social History   Socioeconomic History  . Marital status: Married    Spouse name: Not on file  . Number of children: Not on file  . Years of education: Not on file  . Highest education level: Not on file  Occupational History  . Not on file  Tobacco Use  . Smoking status: Former Smoker    Quit date: 09/07/1998    Years since quitting: 20.9  . Smokeless tobacco: Never Used  Substance and Sexual Activity  . Alcohol use: Yes    Comment: occ  . Drug use: No  . Sexual activity: Not on file  Other Topics Concern  . Not on file  Social History Narrative  . Not on file   Social Determinants of Health   Financial Resource Strain:   .  Difficulty of Paying Living Expenses:   Food Insecurity:   . Worried About Charity fundraiser in the Last Year:   . Arboriculturist in the Last Year:   Transportation Needs:   . Film/video editor (Medical):   Marland Kitchen Lack of Transportation (Non-Medical):   Physical Activity:   . Days of Exercise per Week:   . Minutes of Exercise per Session:   Stress:   . Feeling of Stress :   Social Connections:   . Frequency of Communication with Friends and Family:   . Frequency of Social Gatherings with Friends and Family:   . Attends Religious Services:   . Active Member of Clubs or Organizations:   . Attends Theatre manager Meetings:   Marland Kitchen Marital Status:   Intimate Partner Violence:   . Fear of Current or Ex-Partner:   . Emotionally Abused:   Marland Kitchen Physically Abused:   . Sexually Abused:     Review of Systems: See HPI, otherwise negative ROS  Physical Exam: BP 131/84   Pulse 65   Temp (!) 96.6 F (35.9 C) (Temporal)   Resp 16   Ht 5\' 3"  (1.6 m)   Wt 77.3 kg   SpO2 98%   BMI 30.18 kg/m  General:   Alert,  pleasant and cooperative in NAD Head:  Normocephalic and atraumatic. Neck:  Supple; no masses or thyromegaly. Lungs:  Clear throughout to auscultation, normal respiratory effort.    Heart:  +S1, +S2, Regular rate and rhythm, No edema. Abdomen:  Soft, nontender and nondistended. Normal bowel sounds, without guarding, and without rebound.   Neurologic:  Alert and  oriented x4;  grossly normal neurologically.  Impression/Plan: Alexandra Chen is here for an colonoscopy to be performed for Screening colonoscopy average risk   Risks, benefits, limitations, and alternatives regarding  colonoscopy have been reviewed with the patient.  Questions have been answered.  All parties agreeable.   Jonathon Bellows, MD  08/04/2019, 10:04 AM

## 2019-08-04 NOTE — Op Note (Signed)
[facility] Gastroenterology Patient Name: Alexandra Chen Procedure Date: 08/04/2019 10:04 AM MRN: [id number] Account #: [id number] Date of Birth: Mar 20, 1955 Admit Type: Outpatient Age: 64 Room: [facility] ENDO ROOM 4 Gender: Female Note Status: Finalized Procedure:             Colonoscopy Indications:           Screening for colorectal malignant neoplasm Providers:             Wyline Mood MD, MD Medicines:             Monitored Anesthesia Care Complications:         No immediate complications. Procedure:             Pre-Anesthesia Assessment:                        - Prior to the procedure, a History and Physical was                         performed, and patient medications, allergies and                         sensitivities were reviewed. The patient's tolerance                         of previous anesthesia was reviewed.                        - The risks and benefits of the procedure and the                         sedation options and risks were discussed with the                         patient. All questions were answered and informed                         consent was obtained.                        - ASA Grade Assessment: II - A patient with mild                         systemic disease.                        After obtaining informed consent, the colonoscope was                         passed under direct vision. Throughout the procedure,                         the patient's blood pressure, pulse, and oxygen                         saturations were monitored continuously. The                         Colonoscope was introduced through the anus and  advanced to the the cecum, identified by the                         appendiceal orifice. The colonoscopy was performed                         with ease. The patient tolerated the procedure well.                         The quality of the bowel preparation was good. Findings:      The  perianal and digital rectal examinations were normal.      The entire examined colon appeared normal on direct and retroflexion       views.      Multiple small-mouthed diverticula were found in the entire colon. Impression:            - The entire examined colon is normal on direct and                         retroflexion views.                        - Diverticulosis in the entire examined colon.                        - No specimens collected. Recommendation:        - Discharge patient to home (with escort).                        - Resume previous diet.                        - Continue present medications.                        - Repeat colonoscopy in 10 years for screening                         purposes. Procedure Code(s):     --- Professional ---                        [phone], Colonoscopy, flexible; diagnostic, including                         collection of specimen(s) by brushing or washing, when                         performed (separate procedure) Diagnosis Code(s):     --- Professional ---                        Z12.11, Encounter for screening for malignant neoplasm                         of colon                        K57.30, Diverticulosis of large intestine without                         perforation or abscess  without bleeding CPT copyright 2019 [employer]. All rights reserved. The codes documented in this report are preliminary and upon coder review may  be revised to meet current compliance requirements. Wyline Mood, MD Wyline Mood MD, MD 08/04/2019 10:31:09 AM This report has been signed electronically. Number of Addenda: 0 Note Initiated On: 08/04/2019 10:04 AM Scope Withdrawal Time: 0 hours 9 minutes 21 seconds  Total Procedure Duration: 0 hours 12 minutes 30 seconds  Estimated Blood Loss:  Estimated blood loss: none.      [facility]

## 2019-08-04 NOTE — Anesthesia Postprocedure Evaluation (Signed)
Anesthesia Post Note  Patient: Alexandra Chen  Procedure(s) Performed: COLONOSCOPY WITH PROPOFOL (N/A )  Patient location during evaluation: Endoscopy Anesthesia Type: General Level of consciousness: awake and alert Pain management: pain level controlled Vital Signs Assessment: post-procedure vital signs reviewed and stable Respiratory status: spontaneous breathing, nonlabored ventilation, respiratory function stable and patient connected to nasal cannula oxygen Cardiovascular status: blood pressure returned to baseline and stable Postop Assessment: no apparent nausea or vomiting Anesthetic complications: no     Last Vitals:  Vitals:   08/04/19 1050 08/04/19 1100  BP: 128/75 132/75  Pulse: (!) 58 (!) 55  Resp: 14 14  Temp:    SpO2: 97% 98%    Last Pain:  Vitals:   08/04/19 1030  TempSrc: Temporal  PainSc:                  Cleda Mccreedy [place]

## 2019-08-07 ENCOUNTER — Encounter: Payer: Self-pay | Admitting: *Deleted

## 2019-09-15 ENCOUNTER — Other Ambulatory Visit: Payer: Self-pay | Admitting: Physician Assistant

## 2019-09-15 DIAGNOSIS — Z1231 Encounter for screening mammogram for malignant neoplasm of breast: Secondary | ICD-10-CM

## 2019-10-27 ENCOUNTER — Ambulatory Visit
Admission: RE | Admit: 2019-10-27 | Discharge: 2019-10-27 | Disposition: A | Discharge status: Home / Self Care | Payer: Managed Care, Other (non HMO) | Source: Ambulatory Visit | Attending: Physician Assistant | Admitting: Physician Assistant

## 2019-10-27 ENCOUNTER — Other Ambulatory Visit: Payer: Self-pay

## 2019-10-27 DIAGNOSIS — Z1231 Encounter for screening mammogram for malignant neoplasm of breast: Secondary | ICD-10-CM | POA: Diagnosis not present

## 2019-11-29 IMAGING — MG DIGITAL SCREENING BILATERAL MAMMOGRAM WITH TOMO AND CAD
8 series · 8 of 24 positions shown · non-contrast
Comparison: Previous exam(s).

CLINICAL DATA: Screening.

EXAM:
DIGITAL SCREENING BILATERAL MAMMOGRAM WITH TOMO AND CAD

[L MLO synth-2D]
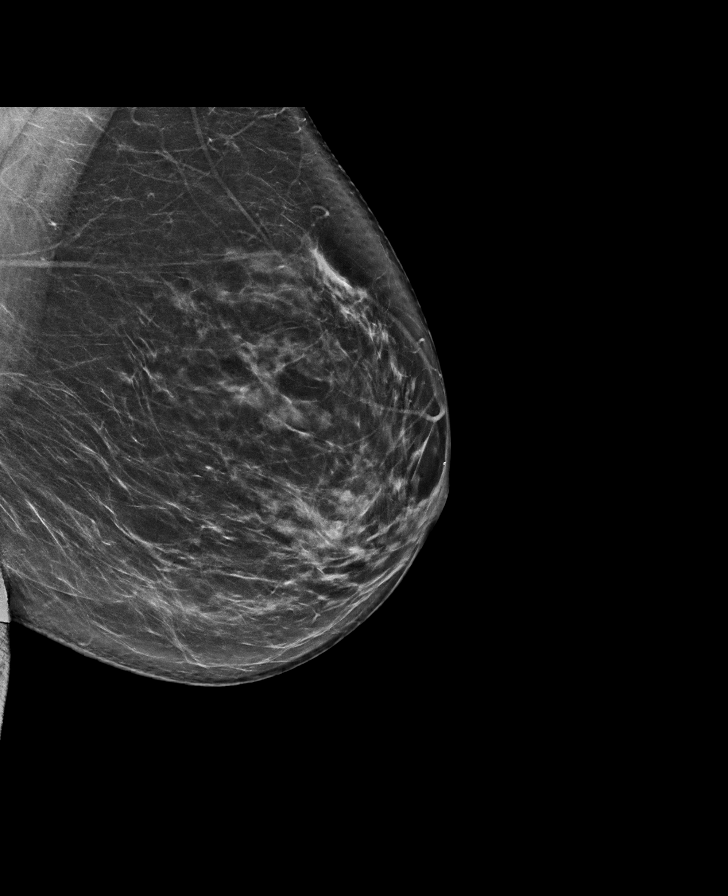

[R CC synth-2D]
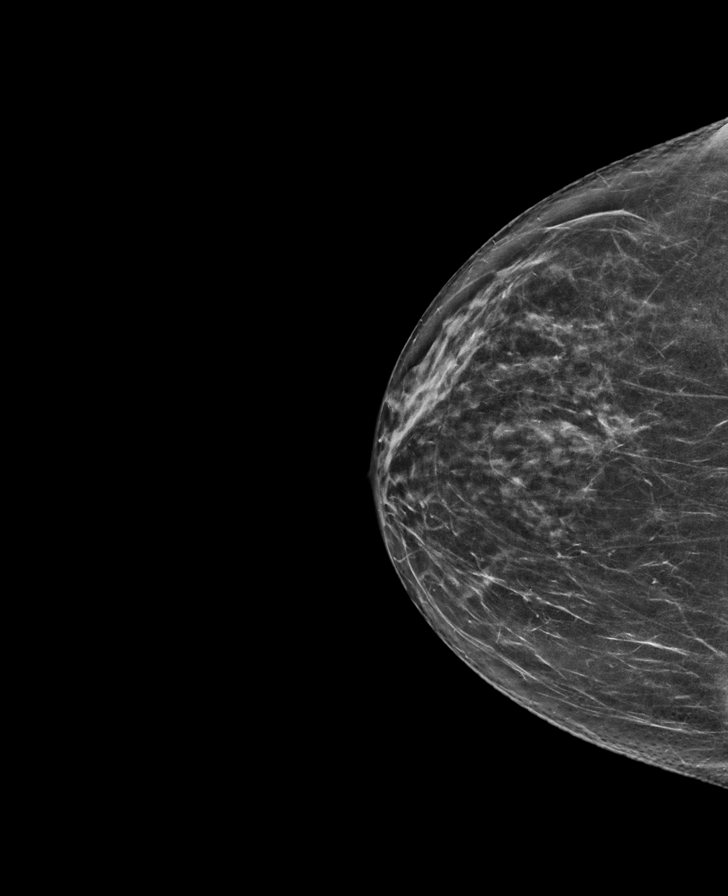

[R MLO synth-2D]
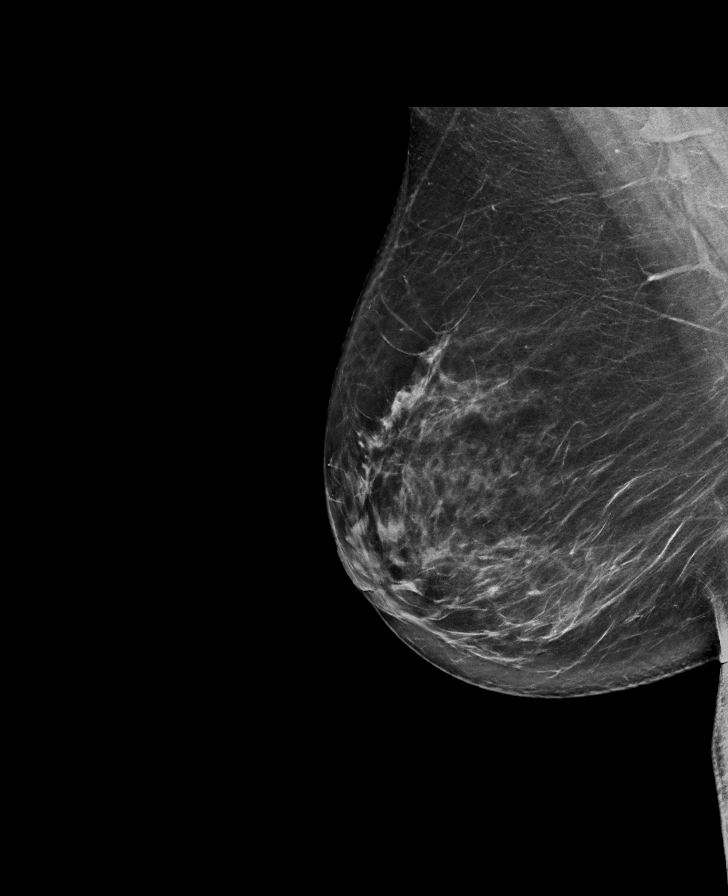

[L CC synth-2D]
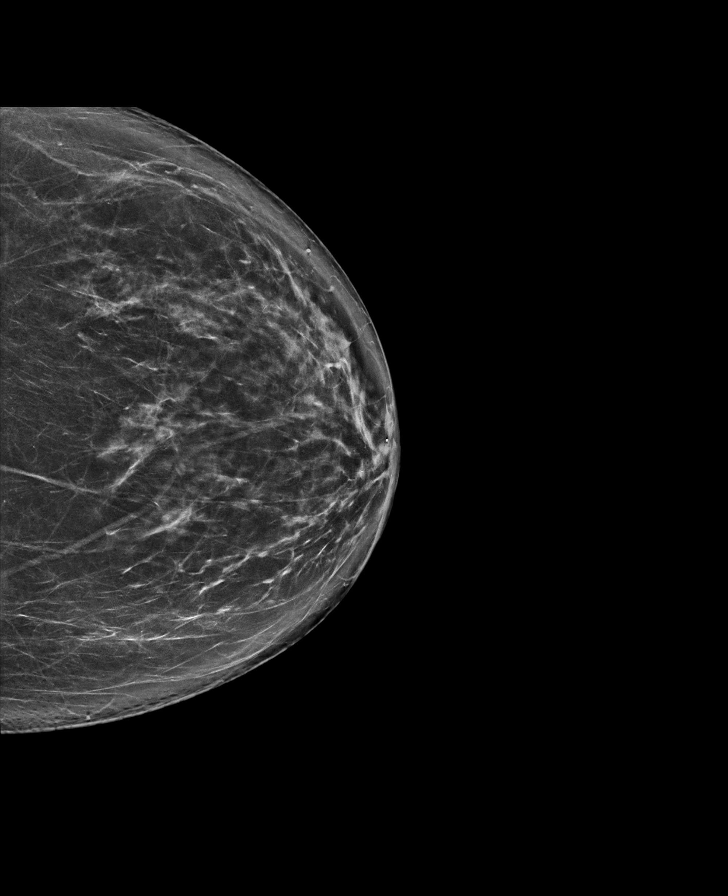

[R CC tomo · tomo slice 35/69.0]
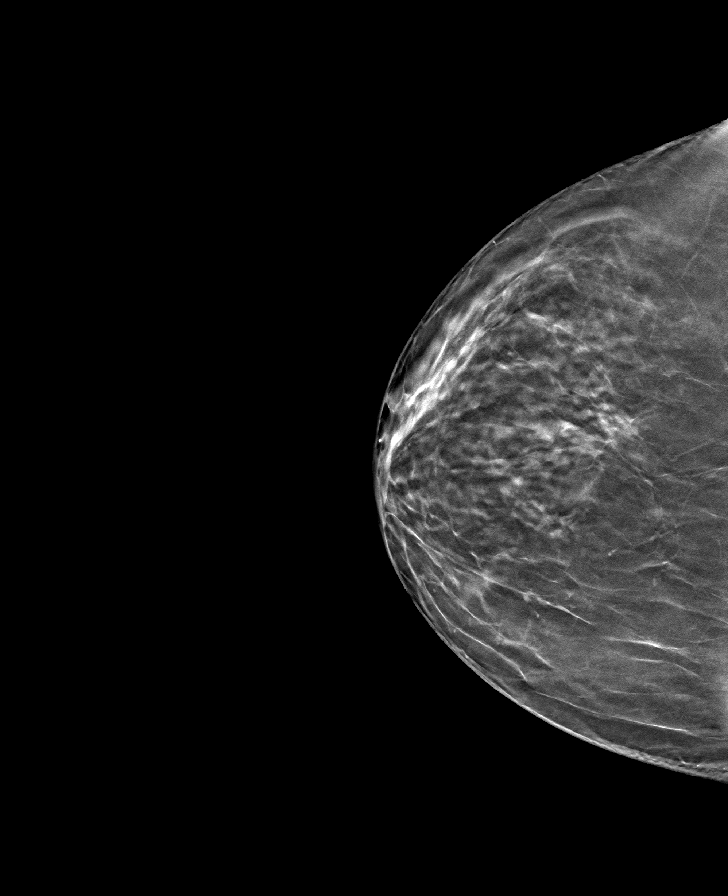

[R MLO tomo · tomo slice 44/87.0]
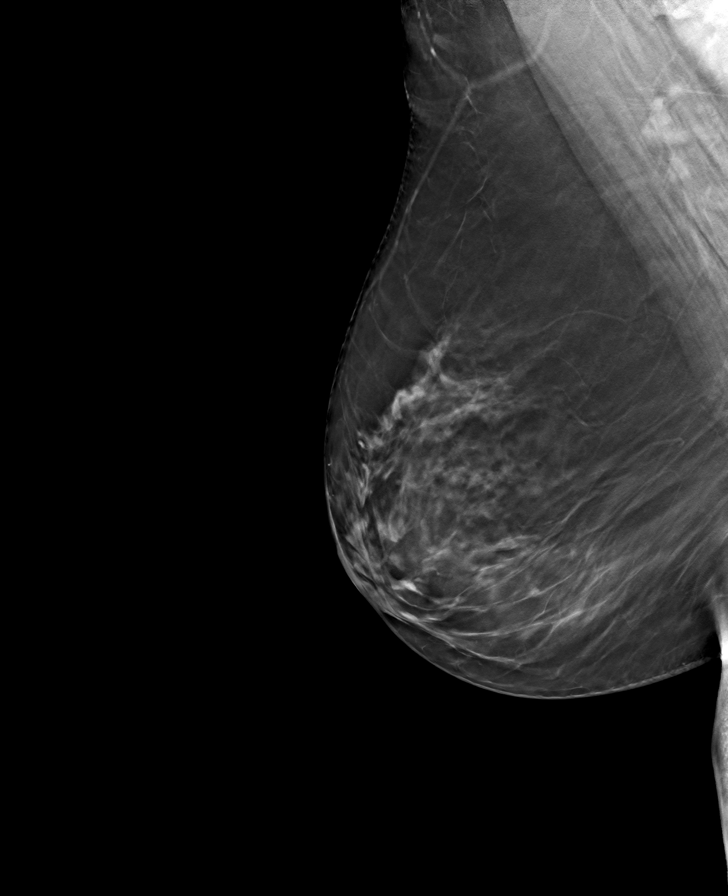

[L CC tomo · tomo slice 43/84.0]
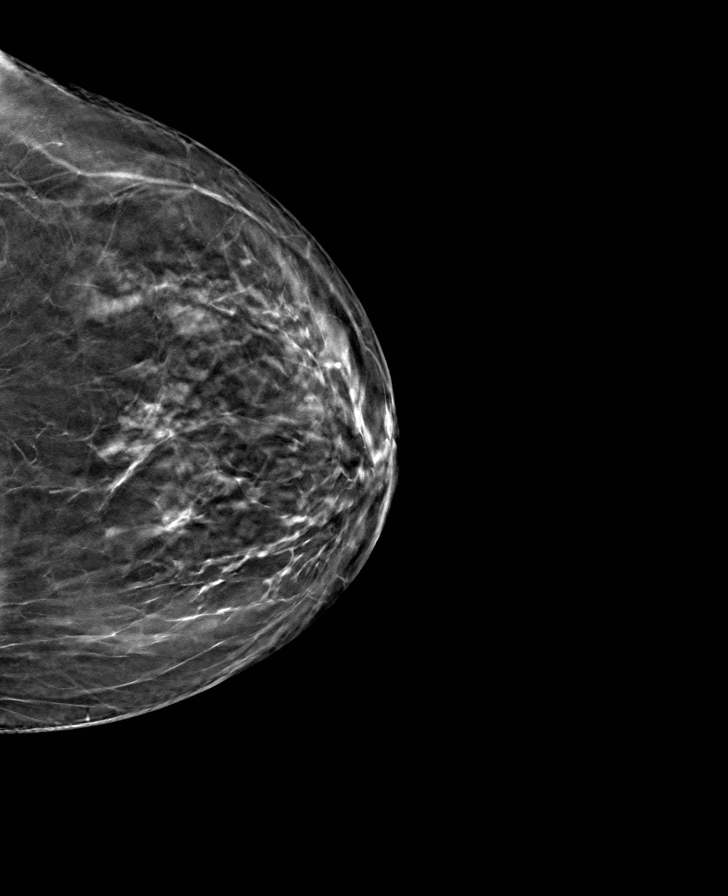

[L MLO tomo · tomo slice 43/85.0]
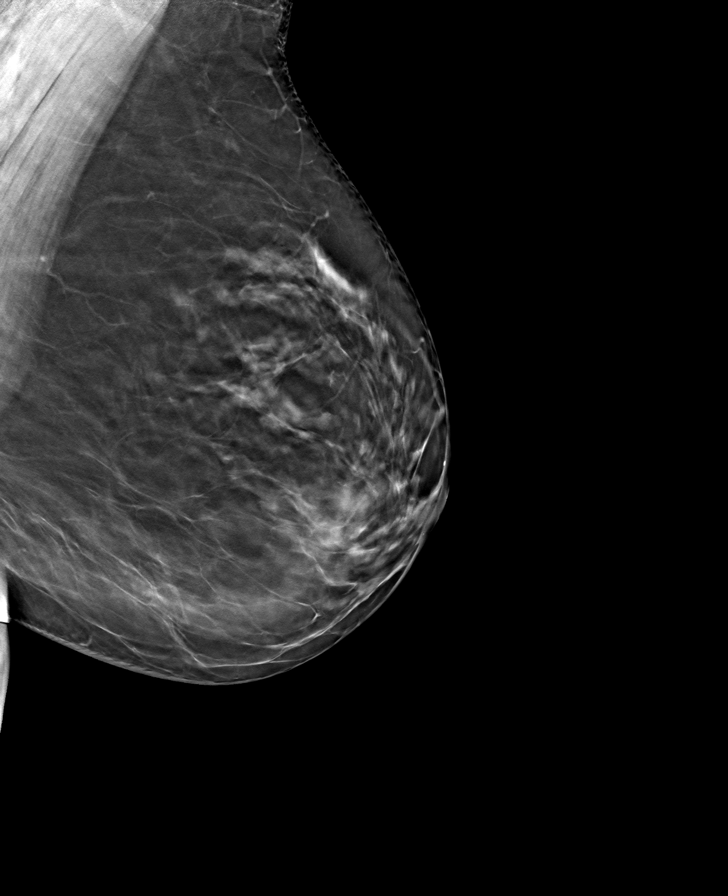

[8 of 24 positions shown; findings below may reference images not displayed]

ACR Breast Density Category b: There are scattered areas of
fibroglandular density.
FINDINGS: There are no findings suspicious for malignancy. Images were
processed with CAD.
IMPRESSION: No mammographic evidence of malignancy. A result letter of this
screening mammogram will be mailed directly to the patient.

RECOMMENDATION:
Screening mammogram in one year. (Code:[id number])

BI-RADS CATEGORY  1: Negative.

## 2021-12-10 ENCOUNTER — Other Ambulatory Visit: Payer: Self-pay | Admitting: Family Medicine

## 2021-12-10 DIAGNOSIS — Z1231 Encounter for screening mammogram for malignant neoplasm of breast: Secondary | ICD-10-CM

## 2021-12-29 ENCOUNTER — Ambulatory Visit
Admission: RE | Admit: 2021-12-29 | Discharge: 2021-12-29 | Disposition: A | Discharge status: Home / Self Care | Payer: Managed Care, Other (non HMO) | Source: Ambulatory Visit | Attending: Family Medicine | Admitting: Family Medicine

## 2021-12-29 DIAGNOSIS — Z1231 Encounter for screening mammogram for malignant neoplasm of breast: Secondary | ICD-10-CM | POA: Diagnosis present

## 2022-05-18 ENCOUNTER — Encounter: Payer: Self-pay | Admitting: Family Medicine

## 2022-05-18 ENCOUNTER — Other Ambulatory Visit: Payer: Self-pay | Admitting: Family Medicine

## 2022-05-18 DIAGNOSIS — M8580 Other specified disorders of bone density and structure, unspecified site: Secondary | ICD-10-CM

## 2022-05-18 DIAGNOSIS — Z780 Asymptomatic menopausal state: Secondary | ICD-10-CM

## 2022-12-29 ENCOUNTER — Other Ambulatory Visit: Payer: Self-pay | Admitting: Internal Medicine

## 2022-12-29 DIAGNOSIS — Z1231 Encounter for screening mammogram for malignant neoplasm of breast: Secondary | ICD-10-CM

## 2023-01-15 ENCOUNTER — Ambulatory Visit
Admission: RE | Admit: 2023-01-15 | Discharge: 2023-01-15 | Disposition: A | Discharge status: Home / Self Care | Payer: Medicare Other | Source: Ambulatory Visit | Attending: Internal Medicine | Admitting: Internal Medicine

## 2023-01-15 DIAGNOSIS — Z1231 Encounter for screening mammogram for malignant neoplasm of breast: Secondary | ICD-10-CM | POA: Diagnosis present

## 2024-01-10 ENCOUNTER — Other Ambulatory Visit: Payer: Self-pay | Admitting: Internal Medicine

## 2024-01-10 DIAGNOSIS — Z1231 Encounter for screening mammogram for malignant neoplasm of breast: Secondary | ICD-10-CM

## 2024-02-01 ENCOUNTER — Ambulatory Visit
Admission: RE | Admit: 2024-02-01 | Discharge: 2024-02-01 | Disposition: A | Source: Ambulatory Visit | Attending: Internal Medicine | Admitting: Internal Medicine

## 2024-02-01 DIAGNOSIS — Z1231 Encounter for screening mammogram for malignant neoplasm of breast: Secondary | ICD-10-CM | POA: Insufficient documentation
